# Patient Record
Sex: Male | Born: 1950 | Race: White | Hispanic: No | Marital: Married | Smoking: Never smoker
Health system: Southern US, Community
[De-identification: ages and names within clinical notes are randomized; demographics above are authoritative.]

## PROBLEM LIST (undated history)

## (undated) DIAGNOSIS — I480 Paroxysmal atrial fibrillation: Secondary | ICD-10-CM

## (undated) DIAGNOSIS — I4811 Longstanding persistent atrial fibrillation: Secondary | ICD-10-CM

## (undated) DIAGNOSIS — Z7901 Long term (current) use of anticoagulants: Secondary | ICD-10-CM

## (undated) DIAGNOSIS — K5790 Diverticulosis of intestine, part unspecified, without perforation or abscess without bleeding: Secondary | ICD-10-CM

## (undated) DIAGNOSIS — R7302 Impaired glucose tolerance (oral): Secondary | ICD-10-CM

## (undated) DIAGNOSIS — Z6829 Body mass index (BMI) 29.0-29.9, adult: Secondary | ICD-10-CM

## (undated) HISTORY — DX: Longstanding persistent atrial fibrillation: I48.11

## (undated) HISTORY — DX: Paroxysmal atrial fibrillation: I48.0

## (undated) HISTORY — DX: Long term (current) use of anticoagulants: Z79.01

## (undated) HISTORY — DX: Diverticulosis of intestine, part unspecified, without perforation or abscess without bleeding: K57.90

## (undated) HISTORY — DX: Impaired glucose tolerance (oral): R73.02

## (undated) HISTORY — DX: Body mass index (BMI) 29.0-29.9, adult: Z68.29

## (undated) HISTORY — PX: CATARACT EXTRACTION, BILATERAL: SHX1313

---

## 2018-06-08 DIAGNOSIS — Z23 Encounter for immunization: Secondary | ICD-10-CM | POA: Diagnosis not present

## 2018-06-08 DIAGNOSIS — I4891 Unspecified atrial fibrillation: Secondary | ICD-10-CM | POA: Diagnosis not present

## 2019-01-28 DIAGNOSIS — Z6829 Body mass index (BMI) 29.0-29.9, adult: Secondary | ICD-10-CM | POA: Diagnosis not present

## 2019-01-28 DIAGNOSIS — I4811 Longstanding persistent atrial fibrillation: Secondary | ICD-10-CM | POA: Diagnosis not present

## 2019-01-28 DIAGNOSIS — Z13220 Encounter for screening for lipoid disorders: Secondary | ICD-10-CM | POA: Diagnosis not present

## 2019-01-28 DIAGNOSIS — Z7901 Long term (current) use of anticoagulants: Secondary | ICD-10-CM | POA: Diagnosis not present

## 2019-01-28 DIAGNOSIS — R7302 Impaired glucose tolerance (oral): Secondary | ICD-10-CM | POA: Diagnosis not present

## 2019-01-28 DIAGNOSIS — Z125 Encounter for screening for malignant neoplasm of prostate: Secondary | ICD-10-CM | POA: Diagnosis not present

## 2019-01-28 DIAGNOSIS — Z0000 Encounter for general adult medical examination without abnormal findings: Secondary | ICD-10-CM | POA: Diagnosis not present

## 2019-01-28 DIAGNOSIS — K5790 Diverticulosis of intestine, part unspecified, without perforation or abscess without bleeding: Secondary | ICD-10-CM | POA: Diagnosis not present

## 2019-02-19 ENCOUNTER — Telehealth: Payer: Self-pay

## 2019-02-19 ENCOUNTER — Encounter: Payer: Self-pay | Admitting: Cardiology

## 2019-02-19 DIAGNOSIS — I480 Paroxysmal atrial fibrillation: Secondary | ICD-10-CM | POA: Insufficient documentation

## 2019-02-19 DIAGNOSIS — K5790 Diverticulosis of intestine, part unspecified, without perforation or abscess without bleeding: Secondary | ICD-10-CM | POA: Insufficient documentation

## 2019-02-19 NOTE — Progress Notes (Signed)
Virtual Visit via Video Note   This visit type was conducted due to national recommendations for restrictions regarding the COVID-19 Pandemic (e.g. social distancing) in an effort to limit this patient's exposure and mitigate transmission in our community.  Due to his co-morbid illnesses, this patient is at least at moderate risk for complications without adequate follow up.  This format is felt to be most appropriate for this patient at this time.  All issues noted in this document were discussed and addressed.  A limited physical exam was performed with this format.  Please refer to the patient's chart for his consent to telehealth for [facility].   Evaluation Performed: Cardiology Consult  This visit type was conducted due to national recommendations for restrictions regarding the COVID-19 Pandemic (e.g. social distancing).  This format is felt to be most appropriate for this patient at this time.  All issues noted in this document were discussed and addressed.  No physical exam was performed (except for noted visual exam findings with Video Visits).  Please refer to the patient's chart (MyChart message for video visits and phone note for telephone visits) for the patient's consent to telehealth for [facility].  Date:  02/20/2019   ID:  Kevin Wilkins, DOB 01/26/1951, MRN [id number]  Patient Location:  Home  Provider location:   [place]  PCP:  Koren ShiverMasneri, Shannon M, DO  Cardiologist:  NEW Electrophysiologist:  None   Chief Complaint:  Establish cardiac care  History of Present Illness:    Kevin Wilkins is a 68 y.o. male who presents via audio/video conferencing for a telehealth visit today in referral by Myles LippsShannon Masneri, DO to establish cardiac care for atrial fibrillation.  This is a very pleasant 68yo male who recently moved from [place] to [place] last fall.  He has a hx of PAF and is on Sotolol and Pradaxa.  He was initially dx with PAF in 2012.  His afib has been fairly well  controlled on Sotolol 40mg  BID.  His only other medical problem is a hx of diverticulosis but no problems with rectal bleeding.    He is here today for followup and is doing well.  He denies any chest pain or pressure, SOB, DOE, PND, orthopnea, LE edema, dizziness or syncope. He is compliant with his meds and is tolerating meds with no SE.   He tells me that he rarely has any problems with skipped heartbeats.   The patient does not have symptoms concerning for COVID-19 infection (fever, chills, cough, or new shortness of breath).   Prior CV studies:   The following studies were reviewed today: none  Past Medical History:  Diagnosis Date  . Adult body mass index 29.0-29.9   . Chronic anticoagulation   . Diverticulosis   . Impaired glucose tolerance   . Longstanding persistent atrial fibrillation   . PAF (paroxysmal atrial fibrillation) (HCC)     Current Meds  Medication Sig  . PRADAXA 150 MG CAPS capsule Take 150 mg by mouth 2 (two) times a day.  . sotalol (BETAPACE) 80 MG tablet Take 40 mg by mouth 2 (two) times a day.     Allergies:   Patient has no known allergies.   Social History   Tobacco Use  . Smoking status: Never Smoker  . Smokeless tobacco: Never Used  Substance Use Topics  . Alcohol use: Not on file  . Drug use: Not on file     Family Hx: The patient's family history includes Congestive Heart Failure  in his mother.  ROS:   Please see the history of present illness.     All other systems reviewed and are negative.   Labs/Other Tests and Data Reviewed:    Recent Labs: No results found for requested labs within last 8760 hours.   Recent Lipid Panel No results found for: CHOL, TRIG, HDL, CHOLHDL, LDLCALC, LDLDIRECT  Wt Readings from Last 3 Encounters:  02/20/19 200 lb (90.7 kg)     Objective:    Vital Signs:  BP 130/84   Pulse 70   Ht 5\' 10"  (1.778 m)   Wt 200 lb (90.7 kg)   BMI 28.70 kg/m    CONSTITUTIONAL:  Well nourished, well developed  male in no acute distress.  EYES: anicteric MOUTH: oral mucosa is pink RESPIRATORY: Normal respiratory effort, symmetric expansion CARDIOVASCULAR: No peripheral edema SKIN: No rash, lesions or ulcers MUSCULOSKELETAL: no digital cyanosis NEURO: Cranial Nerves II-XII grossly intact, moves all extremities PSYCH: Intact judgement and insight.  A&O x 3, Mood/affect appropriate   ASSESSMENT & PLAN:    1.  Paroxysmal atrial fibrillation - he has been maintaining NSR for some time on Sotolol 40mg  BID.  He denies any palpitations.  He has not had any bleeding problems on Pradaxa.  He will continue on Sotolol and Pradaxa.  I will try to get old records of 2D echo in the past.  He is interested in getting off anticoagulation since his A. fib is fairly well controlled.  I have recommended getting a 30-day long-term ZIO patch monitor to assess for A. fib burden.  If there is no A. fib then I think it is fine for him to stop Pradaxa since his CHADS2VASC score is only 1.  2.  COVID-19 Education:The signs and symptoms of COVID-19 were discussed with the patient and how to seek care for testing (follow up with PCP or arrange E-visit).  The importance of social distancing was discussed today.  Patient Risk:   After full review of this patient's clinical status, I feel that they are at least moderate risk at this time.  Time:   Today, I have spent 20 minutes directly with the patient on video discussing medical problems including PAF.  We also reviewed the symptoms of COVID 19 and the ways to protect against contracting the virus with telehealth technology.  I spent an additional 5 minutes reviewing patient's chart including office notes from PCP.  Medication Adjustments/Labs and Tests Ordered: Current medicines are reviewed at length with the patient today.  Concerns regarding medicines are outlined above.  Tests Ordered: No orders of the defined types were placed in this encounter.  Medication  Changes: No orders of the defined types were placed in this encounter.   Disposition:  Follow up in 6 month(s)  Signed, Armanda Magic, MD  02/20/2019 9:57 AM    [facility]

## 2019-02-19 NOTE — Telephone Encounter (Signed)
Virtual Visit Pre-Appointment Phone Call  "(Name), I am calling you today to discuss your upcoming appointment. We are currently trying to limit exposure to the virus that causes COVID-19 by seeing patients at home rather than in the office."  1. "What is the BEST phone number to call the day of the visit?" - include this in appointment notes  2. "Do you have or have access to (through a family member/friend) a smartphone with video capability that we can use for your visit?" a. If yes - list this number in appt notes as "cell" (if different from BEST phone #) and list the appointment type as a VIDEO visit in appointment notes b. If no - list the appointment type as a PHONE visit in appointment notes  3. Confirm consent - "In the setting of the current Covid19 crisis, you are scheduled for a (phone or video) visit with your provider on (date) at (time).  Just as we do with many in-office visits, in order for you to participate in this visit, we must obtain consent.  If you'd like, I can send this to your mychart (if signed up) or email for you to review.  Otherwise, I can obtain your verbal consent now.  All virtual visits are billed to your insurance company just like a normal visit would be.  By agreeing to a virtual visit, we'd like you to understand that the technology does not allow for your provider to perform an examination, and thus may limit your provider's ability to fully assess your condition. If your provider identifies any concerns that need to be evaluated in person, we will make arrangements to do so.  Finally, though the technology is pretty good, we cannot assure that it will always work on either your or our end, and in the setting of a video visit, we may have to convert it to a phone-only visit.  In either situation, we cannot ensure that we have a secure connection.  Are you willing to proceed?" STAFF: Did the patient verbally acknowledge consent to telehealth visit? Document  YES/NO here: YES  4. Advise patient to be prepared - "Two hours prior to your appointment, go ahead and check your blood pressure, pulse, oxygen saturation, and your weight (if you have the equipment to check those) and write them all down. When your visit starts, your provider will ask you for this information. If you have an Apple Watch or Kardia device, please plan to have heart rate information ready on the day of your appointment. Please have a pen and paper handy nearby the day of the visit as well."  5. Give patient instructions for MyChart download to smartphone OR Doximity/Doxy.me as below if video visit (depending on what platform provider is using)  6. Inform patient they will receive a phone call 15 minutes prior to their appointment time (may be from unknown caller ID) so they should be prepared to answer    TELEPHONE CALL NOTE  Kevin Wilkins has been deemed a candidate for a follow-up tele-health visit to limit community exposure during the Covid-19 pandemic. I spoke with the patient via phone to ensure availability of phone/video source, confirm preferred email & phone number, and discuss instructions and expectations.  I reminded Kevin Wilkins to be prepared with any vital sign and/or heart rhythm information that could potentially be obtained via home monitoring, at the time of his visit. I reminded Kevin Wilkins to expect a phone call prior to his visit.  Cierah Crader, CMA 02/19/2019 4:26 PM   INSTRUCTIONS FOR DOWNLOADING THE MYCHART APP TO SMARTPHONE  - The patient must first make sure to have activated MyChart and know their login information - If Apple, go to [employer] Store and type in MyChart in the search bar and download the app. If Android, ask patient to go to [employer] Play Store and type in [place] in the search bar and download the app. The app is free but as with any other app downloads, their phone may require them to verify saved payment information or  Apple/Android password.  - The patient will need to then log into the app with their MyChart username and password, and select [place] as their healthcare provider to link the account. When it is time for your visit, go to the MyChart app, find appointments, and click Begin Video Visit. Be sure to Select Allow for your device to access the Microphone and Camera for your visit. You will then be connected, and your provider will be with you shortly.  **If they have any issues connecting, or need assistance please contact MyChart service desk [phone])**  **If using a computer, in order to ensure the best quality for their visit they will need to use either of the following Internet Browsers: [employer] Edge, or Google Chrome**  IF USING DOXIMITY or DOXY.ME - The patient will receive a link just prior to their visit by text.     FULL LENGTH CONSENT FOR TELE-HEALTH VISIT   I hereby voluntarily request, consent and authorize [facility] and its employed or contracted physicians, physician assistants, nurse practitioners or other licensed health care professionals (the Practitioner), to provide me with telemedicine health care services (the "Services") as deemed necessary by the treating Practitioner. I acknowledge and consent to receive the Services by the Practitioner via telemedicine. I understand that the telemedicine visit will involve communicating with the Practitioner through live audiovisual communication technology and the disclosure of certain medical information by electronic transmission. I acknowledge that I have been given the opportunity to request an in-person assessment or other available alternative prior to the telemedicine visit and am voluntarily participating in the telemedicine visit.  I understand that I have the right to withhold or withdraw my consent to the use of telemedicine in the course of my care at any time, without affecting my right to future care  or treatment, and that the Practitioner or I may terminate the telemedicine visit at any time. I understand that I have the right to inspect all information obtained and/or recorded in the course of the telemedicine visit and may receive copies of available information for a reasonable fee.  I understand that some of the potential risks of receiving the Services via telemedicine include:  Marland Kitchen. Delay or interruption in medical evaluation due to technological equipment failure or disruption; . Information transmitted may not be sufficient (e.g. poor resolution of images) to allow for appropriate medical decision making by the Practitioner; and/or  . In rare instances, security protocols could fail, causing a breach of personal health information.  Furthermore, I acknowledge that it is my responsibility to provide information about my medical history, conditions and care that is complete and accurate to the best of my ability. I acknowledge that Practitioner's advice, recommendations, and/or decision may be based on factors not within their control, such as incomplete or inaccurate data provided by me or distortions of diagnostic images or specimens that may result from electronic transmissions. I understand that the  practice of medicine is not an Chief Strategy Officer and that Practitioner makes no warranties or guarantees regarding treatment outcomes. I acknowledge that I will receive a copy of this consent concurrently upon execution via email to the email address I last provided but may also request a printed copy by calling the office of [place].    I understand that my insurance will be billed for this visit.   I have read or had this consent read to me. . I understand the contents of this consent, which adequately explains the benefits and risks of the Services being provided via telemedicine.  . I have been provided ample opportunity to ask questions regarding this consent and the Services and have had  my questions answered to my satisfaction. . I give my informed consent for the services to be provided through the use of telemedicine in my medical care  By participating in this telemedicine visit I agree to the above.

## 2019-02-20 ENCOUNTER — Telehealth: Payer: Self-pay | Admitting: Radiology

## 2019-02-20 ENCOUNTER — Encounter: Payer: Self-pay | Admitting: Cardiology

## 2019-02-20 ENCOUNTER — Other Ambulatory Visit: Payer: Self-pay

## 2019-02-20 ENCOUNTER — Telehealth (INDEPENDENT_AMBULATORY_CARE_PROVIDER_SITE_OTHER): Payer: Medicare HMO | Admitting: Cardiology

## 2019-02-20 VITALS — BP 130/84 | HR 70 | Ht 70.0 in | Wt 200.0 lb

## 2019-02-20 DIAGNOSIS — I480 Paroxysmal atrial fibrillation: Secondary | ICD-10-CM | POA: Diagnosis not present

## 2019-02-20 DIAGNOSIS — Z7189 Other specified counseling: Secondary | ICD-10-CM | POA: Diagnosis not present

## 2019-02-20 NOTE — Patient Instructions (Signed)
Medication Instructions:  Your physician recommends that you continue on your current medications as directed. Please refer to the Current Medication list given to you today.  If you need a refill on your cardiac medications before your next appointment, please call your pharmacy.   Lab work: None If you have labs (blood work) drawn today and your tests are completely normal, you will receive your results only by: Marland Kitchen MyChart Message (if you have MyChart) OR . A paper copy in the mail If you have any lab test that is abnormal or we need to change your treatment, we will call you to review the results.  Testing/Procedures: Schedule a Conservator, museum/gallery, 30 day.   Follow-Up: At [facility], you and your health needs are our priority.  As part of our continuing mission to provide you with exceptional heart care, we have created designated Provider Care Teams.  These Care Teams include your primary Cardiologist (physician) and Advanced Practice Providers (APPs -  Physician Assistants and Nurse Practitioners) who all work together to provide you with the care you need, when you need it. You will need a follow up appointment in 6 months.  Please call our office 2 months in advance to schedule this appointment.  You may see Dr. Mayford Knife or one of the following Advanced Practice Providers on your designated Care Team:   Nelson, PA-C Ronie Spies, PA-C . Jacolyn Reedy, PA-C

## 2019-02-20 NOTE — Telephone Encounter (Signed)
Enrolled patient for a 30 [employer] Cardiac Event monitor to be mailed. Brief instructions were gone over with the patient and he knows to expect the monitor to arrive in 2-3 days.

## 2019-02-24 ENCOUNTER — Encounter: Payer: Self-pay | Admitting: Cardiology

## 2019-02-24 ENCOUNTER — Ambulatory Visit (INDEPENDENT_AMBULATORY_CARE_PROVIDER_SITE_OTHER): Payer: Medicare HMO

## 2019-02-24 DIAGNOSIS — I480 Paroxysmal atrial fibrillation: Secondary | ICD-10-CM

## 2019-03-12 ENCOUNTER — Telehealth: Payer: Self-pay | Admitting: *Deleted

## 2019-03-12 DIAGNOSIS — I491 Atrial premature depolarization: Secondary | ICD-10-CM

## 2019-03-12 DIAGNOSIS — I480 Paroxysmal atrial fibrillation: Secondary | ICD-10-CM

## 2019-03-12 NOTE — Telephone Encounter (Signed)
Please get a copy of arrhythmias scanned into Epic.  Please order a 2D echo to assess LVF

## 2019-03-12 NOTE — Telephone Encounter (Signed)
Pt returning your call. Please call back °

## 2019-03-12 NOTE — Telephone Encounter (Signed)
Received Day 16 of 30 -Serious- monitor report for pt.   Recorded 03/11/19 7:42 am(CST)   - SR w/ 5 beat run of VTACH/PACs  This was auto triggered. Wearing to look for his PAF, interested in getting off anticoagulation. Left message for patient to call back.  Reviewed with Dr. Elease Hashimoto (DOD)  --non sustained VT, on sotolol, no changes Will forward to Dr. Mayford Knife as [place] Childes

## 2019-03-13 NOTE — Telephone Encounter (Signed)
Spoke with the patient, he accepted having an echo done to assess LVF.

## 2019-03-14 ENCOUNTER — Telehealth: Payer: Self-pay | Admitting: Cardiology

## 2019-03-14 NOTE — Telephone Encounter (Signed)
New Message    Pt is returning a call for [facility]    Please call back

## 2019-03-15 NOTE — Telephone Encounter (Signed)
I still want the studies

## 2019-03-15 NOTE — Telephone Encounter (Signed)
Spoke with the patient, he was exercising during the time of the 5 beats of V-tach. He stated he was doing a difficult workout at the time. He wanted to know if this would change anything.

## 2019-03-15 NOTE — Telephone Encounter (Signed)
Notes recorded by Quintella Reichert, MD on 03/13/2019 at 4:33 PM EDT Please get a lexiscan myoview in addition to echo to evaluate for ischemia given NSVT

## 2019-03-18 NOTE — Telephone Encounter (Signed)
Spoke with the patient, he expressed understanding about having the testing.

## 2019-03-29 ENCOUNTER — Telehealth (HOSPITAL_COMMUNITY): Payer: Self-pay | Admitting: Radiology

## 2019-03-29 NOTE — Telephone Encounter (Signed)
COVID-19 Pre-Screening Questions:  . Do you currently have a fever?No (yes = cancel and refer to pcp for e-visit) . Have you recently travelled on a cruise, internationally, or to [place], [place], [place], [place], [place], or [place], [place] ([place]) ?NO (yes = cancel, stay home, monitor symptoms, and contact pcp or initiate e-visit if symptoms develop) . Have you been in contact with someone that is currently pending confirmation of Covid19 testing or has been confirmed to have the Covid19 virus?NO (yes = cancel, stay home, away from tested individual, monitor symptoms, and contact pcp or initiate e-visit if symptoms develop) . Are you currently experiencing fatigue or coughNO (yes = pt should be prepared to have a mask placed at the time of their visit). . Reiterated no additional visitors. . Arrive no earlier than 15 minutes before appointment time. . Please bring own mask.    

## 2019-04-01 ENCOUNTER — Ambulatory Visit (HOSPITAL_COMMUNITY): Payer: Medicare HMO | Attending: Cardiovascular Disease

## 2019-04-01 ENCOUNTER — Other Ambulatory Visit: Payer: Self-pay

## 2019-04-01 DIAGNOSIS — I491 Atrial premature depolarization: Secondary | ICD-10-CM | POA: Diagnosis not present

## 2019-04-01 DIAGNOSIS — I480 Paroxysmal atrial fibrillation: Secondary | ICD-10-CM | POA: Diagnosis not present

## 2019-04-02 ENCOUNTER — Telehealth: Payer: Self-pay

## 2019-04-02 DIAGNOSIS — I472 Ventricular tachycardia: Secondary | ICD-10-CM

## 2019-04-02 DIAGNOSIS — I4729 Other ventricular tachycardia: Secondary | ICD-10-CM

## 2019-04-02 DIAGNOSIS — I10 Essential (primary) hypertension: Secondary | ICD-10-CM

## 2019-04-02 NOTE — Telephone Encounter (Signed)
Spoke with the patient he expressed understanding about having a cardiac CT and stopping his anticoagulation. He is scheduled for labs on 04/05/19.

## 2019-04-02 NOTE — Telephone Encounter (Signed)
Notes recorded by Sueanne Margarita, MD on 04/01/2019 at 7:51 PM EDT  Please let patient know that heart monitor showed NSR with no afib and fast heart rhythm from bottom of heart for 5 beats. Recent echo showed normal LVF. Please get a coronary CTA to rule out CAD given NSVT. OK to stop anticoagulation given no afib and low CHADS2VASC score.

## 2019-04-02 NOTE — Telephone Encounter (Signed)
Lopressor 50mg  for CTA should be fine

## 2019-04-02 NOTE — Telephone Encounter (Signed)
-----   Message from Sueanne Margarita, MD sent at 04/01/2019  7:52 PM EDT ----- Please get a BMET as well as mag level and TSH

## 2019-04-03 MED ORDER — METOPROLOL TARTRATE 50 MG PO TABS: ORAL_TABLET | ORAL | 0 refills | Status: DC

## 2019-04-05 ENCOUNTER — Other Ambulatory Visit: Payer: Self-pay

## 2019-04-05 ENCOUNTER — Other Ambulatory Visit: Payer: Medicare HMO | Admitting: *Deleted

## 2019-04-05 DIAGNOSIS — I4729 Other ventricular tachycardia: Secondary | ICD-10-CM

## 2019-04-05 DIAGNOSIS — I472 Ventricular tachycardia: Secondary | ICD-10-CM | POA: Diagnosis not present

## 2019-04-05 LAB — BASIC METABOLIC PANEL
BUN/Creatinine Ratio: 18 (ref 10–24)
BUN: 18 mg/dL (ref 8–27)
CO2: 25 mmol/L (ref 20–29)
Calcium: 9.3 mg/dL (ref 8.6–10.2)
Chloride: 100 mmol/L (ref 96–106)
Creatinine, Ser: 1.02 mg/dL (ref 0.76–1.27)
GFR calc Af Amer: 88 mL/min/{1.73_m2} (ref >59)
GFR calc non Af Amer: 76 mL/min/{1.73_m2} (ref >59)
Glucose: 70 mg/dL (ref 65–99)
Potassium: 4.5 mmol/L (ref 3.5–5.2)
Sodium: 138 mmol/L (ref 134–144)

## 2019-04-05 LAB — MAGNESIUM: Magnesium: 1.8 mg/dL (ref 1.6–2.3)

## 2019-04-05 LAB — TSH: TSH: 1.95 u[IU]/mL (ref 0.450–4.500)

## 2019-04-29 ENCOUNTER — Encounter (HOSPITAL_COMMUNITY): Payer: Self-pay

## 2019-04-29 ENCOUNTER — Ambulatory Visit (HOSPITAL_COMMUNITY)
Admission: RE | Admit: 2019-04-29 | Discharge: 2019-04-29 | Disposition: A | Discharge status: Home / Self Care | Payer: Medicare HMO | Source: Ambulatory Visit | Attending: Cardiology | Admitting: Cardiology

## 2019-04-29 ENCOUNTER — Ambulatory Visit (HOSPITAL_COMMUNITY): Admission: RE | Admit: 2019-04-29 | Payer: Medicare HMO | Source: Ambulatory Visit

## 2019-04-29 ENCOUNTER — Other Ambulatory Visit: Payer: Self-pay

## 2019-04-29 DIAGNOSIS — I472 Ventricular tachycardia: Secondary | ICD-10-CM | POA: Diagnosis not present

## 2019-04-29 DIAGNOSIS — I4729 Other ventricular tachycardia: Secondary | ICD-10-CM

## 2019-04-29 MED ORDER — IOHEXOL 350 MG/ML SOLN
80.0000 mL | Freq: Once | INTRAVENOUS | Status: AC | PRN
  Administered 2019-04-29: 14:00:00 80 mL via INTRAVENOUS

## 2019-04-29 MED ORDER — NITROGLYCERIN 0.4 MG SL SUBL
0.8000 mg | SUBLINGUAL_TABLET | Freq: Once | SUBLINGUAL | Status: AC
  Administered 2019-04-29: 0.8 mg via SUBLINGUAL
  Filled 2019-04-29: qty 25

## 2019-04-29 MED ORDER — NITROGLYCERIN 0.4 MG SL SUBL
SUBLINGUAL_TABLET | SUBLINGUAL | Status: AC
  Filled 2019-04-29: qty 2

## 2019-05-01 ENCOUNTER — Telehealth: Payer: Self-pay

## 2019-05-01 NOTE — Telephone Encounter (Signed)
-----   Message from Sueanne Margarita, MD sent at 05/01/2019  4:21 PM EDT ----- COronary CTA showed no evidence of CAD

## 2019-05-01 NOTE — Telephone Encounter (Signed)
Notes recorded by Frederik Schmidt, RN on 05/01/2019 at 4:25 PM EDT  lpmtcb 7/15  ------

## 2019-09-03 DIAGNOSIS — R69 Illness, unspecified: Secondary | ICD-10-CM | POA: Diagnosis not present

## 2019-09-16 DIAGNOSIS — Z20828 Contact with and (suspected) exposure to other viral communicable diseases: Secondary | ICD-10-CM | POA: Diagnosis not present

## 2019-10-01 ENCOUNTER — Telehealth: Payer: Self-pay

## 2019-10-01 NOTE — Telephone Encounter (Signed)
° ° °YOUR CARDIOLOGY TEAM HAS ARRANGED FOR AN E-VISIT FOR YOUR APPOINTMENT - PLEASE REVIEW IMPORTANT INFORMATION BELOW SEVERAL DAYS PRIOR TO YOUR APPOINTMENT ° °Due to the recent COVID-19 pandemic, we are transitioning in-person office visits to tele-medicine visits in an effort to decrease unnecessary exposure to our patients, their families, and staff. These visits are billed to your insurance just like a normal visit is. We also encourage you to sign up for MyChart if you have not already done so. You will need a smartphone if possible. For patients that do not have this, we can still complete the visit using a regular telephone but do prefer a smartphone to enable video when possible. You may have a family member that lives with you that can help. If possible, we also ask that you have a blood pressure cuff and scale at home to measure your blood pressure, heart rate and weight prior to your scheduled appointment. Patients with clinical needs that need an in-person evaluation and testing will still be able to come to the office if absolutely necessary. If you have any questions, feel free to call our office. ° ° ° °THE DAY OF YOUR APPOINTMENT ° °Approximately 15 minutes prior to your scheduled appointment, you will receive a telephone call from one of [facility] team - your caller ID may say "Unknown caller."  Our staff will confirm medications, vital signs for the day and any symptoms you may be experiencing. Please have this information available prior to the time of visit start. It may also be helpful for you to have a pad of paper and pen handy for any instructions given during your visit. They will also walk you through joining the smartphone meeting if this is a video visit. ° ° ° °CONSENT FOR TELE-HEALTH VISIT - PLEASE REVIEW ° °I hereby voluntarily request, consent and authorize [facility] and its employed or contracted physicians, physician assistants, nurse practitioners or other licensed health  care professionals (the Practitioner), to provide me with telemedicine health care services (the “Services") as deemed necessary by the treating Practitioner. I acknowledge and consent to receive the Services by the Practitioner via telemedicine. I understand that the telemedicine visit will involve communicating with the Practitioner through live audiovisual communication technology and the disclosure of certain medical information by electronic transmission. I acknowledge that I have been given the opportunity to request an in-person assessment or other available alternative prior to the telemedicine visit and am voluntarily participating in the telemedicine visit. ° °I understand that I have the right to withhold or withdraw my consent to the use of telemedicine in the course of my care at any time, without affecting my right to future care or treatment, and that the Practitioner or I may terminate the telemedicine visit at any time. I understand that I have the right to inspect all information obtained and/or recorded in the course of the telemedicine visit and may receive copies of available information for a reasonable fee.  I understand that some of the potential risks of receiving the Services via telemedicine include:  °• Delay or interruption in medical evaluation due to technological equipment failure or disruption; °• Information transmitted may not be sufficient (e.g. poor resolution of images) to allow for appropriate medical decision making by the Practitioner; and/or  °• In rare instances, security protocols could fail, causing a breach of personal health information. ° °Furthermore, I acknowledge that it is my responsibility to provide information about my medical history, conditions and care that   is complete and accurate to the best of my ability. I acknowledge that Practitioner's advice, recommendations, and/or decision may be based on factors not within their control, such as incomplete or  inaccurate data provided by me or distortions of diagnostic images or specimens that may result from electronic transmissions. I understand that the practice of medicine is not an exact science and that Practitioner makes no warranties or guarantees regarding treatment outcomes. I acknowledge that I will receive a copy of this consent concurrently upon execution via email to the email address I last provided but may also request a printed copy by calling the office of [facility].   ° °I understand that my insurance will be billed for this visit.  ° °I have read or had this consent read to me. °• I understand the contents of this consent, which adequately explains the benefits and risks of the Services being provided via telemedicine.  °• I have been provided ample opportunity to ask questions regarding this consent and the Services and have had my questions answered to my satisfaction. °• I give my informed consent for the services to be provided through the use of telemedicine in my medical care ° °By participating in this telemedicine visit I agree to the above. ° °

## 2019-10-02 ENCOUNTER — Telehealth (INDEPENDENT_AMBULATORY_CARE_PROVIDER_SITE_OTHER): Payer: Medicare HMO | Admitting: Cardiology

## 2019-10-02 ENCOUNTER — Other Ambulatory Visit: Payer: Self-pay

## 2019-10-02 ENCOUNTER — Encounter: Payer: Self-pay | Admitting: Cardiology

## 2019-10-02 VITALS — BP 140/85 | HR 64 | Ht 70.5 in | Wt 198.0 lb

## 2019-10-02 DIAGNOSIS — I480 Paroxysmal atrial fibrillation: Secondary | ICD-10-CM | POA: Diagnosis not present

## 2019-10-02 NOTE — Patient Instructions (Signed)
Medication Instructions:  °Your physician recommends that you continue on your current medications as directed. Please refer to the Current Medication list given to you today. ° °*If you need a refill on your cardiac medications before your next appointment, please call your pharmacy* ° °Follow-Up: °At [facility], you and your health needs are our priority.  As part of our continuing mission to provide you with exceptional heart care, we have created designated Provider Care Teams.  These Care Teams include your primary Cardiologist (physician) and Advanced Practice Providers (APPs -  Physician Assistants and Nurse Practitioners) who all work together to provide you with the care you need, when you need it. ° ° °Your next appointment:   °1 year(s) ° °The format for your next appointment:   °Either In Person or Virtual ° °Provider:   °You may see Traci Turner, MD or one of the following Advanced Practice Providers on your designated Care Team:   °· Dayna Dunn, PA-C °· Michele Lenze, PA-C ° ° °

## 2019-10-02 NOTE — Progress Notes (Signed)
Virtual Visit via Video Note   This visit type was conducted due to national recommendations for restrictions regarding the COVID-19 Pandemic (e.g. social distancing) in an effort to limit this patient's exposure and mitigate transmission in our community.  Due to his co-morbid illnesses, this patient is at least at moderate risk for complications without adequate follow up.  This format is felt to be most appropriate for this patient at this time.  All issues noted in this document were discussed and addressed.  A limited physical exam was performed with this format.  Please refer to the patient's chart for his consent to telehealth for [facility].   Evaluation Performed:  Follow-up visit  This visit type was conducted due to national recommendations for restrictions regarding the COVID-19 Pandemic (e.g. social distancing).  This format is felt to be most appropriate for this patient at this time.  All issues noted in this document were discussed and addressed.  No physical exam was performed (except for noted visual exam findings with Video Visits).  Please refer to the patient's chart (MyChart message for video visits and phone note for telephone visits) for the patient's consent to telehealth for [facility].  Date:  10/02/2019   ID:  Kevin Wilkins, DOB 1950/12/31, MRN [id number]  Patient Location:  Home  Provider location:   [place]  PCP:  Lyman Bishop, DO  Cardiologist:  Fransico Him, MD  Electrophysiologist:  None   Chief Complaint:  PAF  History of Present Illness:    Kevin Wilkins is a 68 y.o. male who presents via audio/video conferencing for a telehealth visit today.    This is a very pleasant 68yo male with a hx of PAF and is on Sotolol and Pradaxa.  He was initially dx with PAF in 2012.  His afib has been fairly well controlled on Sotolol 40mg  BID. His only other medical problem is a hx of diverticulosis but no problems with rectal bleeding.  His  CHADS2VASC score is 1 (age>65) and he worse a heart monitor after last OV to get off anticoagulation.  This showed no afib and his Pradaxa was stopped.  The heart monitor did show a 5 beat run of WCT and a coronary CTA was done showing no CAD and a Ca score of ).  She is here today for followup and is doing well.  She denies any chest pain or pressure, SOB, DOE, PND, orthopnea, LE edema, dizziness, palpitations or syncope. She is compliant with her meds and is tolerating meds with no SE.    The patient does not have symptoms concerning for COVID-19 infection (fever, chills, cough, or new shortness of breath).    Prior CV studies:   The following studies were reviewed today:  Event monitor and coronary CTA  Past Medical History:  Diagnosis Date  . Adult body mass index 29.0-29.9   . Chronic anticoagulation   . Diverticulosis   . Impaired glucose tolerance   . Longstanding persistent atrial fibrillation ([place])   . PAF (paroxysmal atrial fibrillation) ([place])    Past Surgical History:  Procedure Laterality Date  . CATARACT EXTRACTION, BILATERAL       Current Meds  Medication Sig  . sotalol (BETAPACE) 80 MG tablet Take 40 mg by mouth 2 (two) times a day.     Allergies:   Patient has no known allergies.   Social History   Tobacco Use  . Smoking status: Never Smoker  . Smokeless tobacco: Never Used  Substance Use  Topics  . Alcohol use: Not on file  . Drug use: Not on file     Family Hx: The patient's family history includes Congestive Heart Failure in his mother.  ROS:   Please see the history of present illness.     All other systems reviewed and are negative.   Labs/Other Tests and Data Reviewed:    Recent Labs: 04/05/2019: BUN 18; Creatinine, Ser 1.02; Magnesium 1.8; Potassium 4.5; Sodium 138; TSH 1.950   Recent Lipid Panel No results found for: CHOL, TRIG, HDL, CHOLHDL, LDLCALC, LDLDIRECT  Wt Readings from Last 3 Encounters:  10/02/19 198 lb (89.8 kg)  02/20/19  200 lb (90.7 kg)     Objective:    Vital Signs:  BP 140/85   Pulse 64   Ht 5' 10.5" (1.791 m)   Wt 198 lb (89.8 kg)   BMI 28.01 kg/m    CONSTITUTIONAL:  Well nourished, well developed male in no acute distress.  EYES: anicteric MOUTH: oral mucosa is pink RESPIRATORY: Normal respiratory effort, symmetric expansion CARDIOVASCULAR: No peripheral edema SKIN: No rash, lesions or ulcers MUSCULOSKELETAL: no digital cyanosis NEURO: Cranial Nerves II-XII grossly intact, moves all extremities PSYCH: Intact judgement and insight.  A&O x 3, Mood/affect appropriate   ASSESSMENT & PLAN:    1.  PAF -he has been maintaining NSR and denies any palpitations -he will continue on Sotolol 40mg  BID -QTc on EKG in April 2020 -Pradaxa was stopped due to [facility] score of 1 (age>65) and no PAF on event monitor  COVID-19 Education: The signs and symptoms of COVID-19 were discussed with the patient and how to seek care for testing (follow up with PCP or arrange E-visit).  The importance of social distancing was discussed today.  Patient Risk:   After full review of this patient's clinical status, I feel that they are at least moderate risk at this time.  Time:   Today, I have spent 20 minutes on Telemedicine discussing medical problems including PAF.  We also reviewed the symptoms of COVID 19 and the ways to protect against contracting the virus with telehealth technology.  I spent an additional 5 minutes reviewing patient's chart including event monitor and coronary CTA.  Medication Adjustments/Labs and Tests Ordered: Current medicines are reviewed at length with the patient today.  Concerns regarding medicines are outlined above.  Tests Ordered: No orders of the defined types were placed in this encounter.  Medication Changes: No orders of the defined types were placed in this encounter.   Disposition:  Follow up in 1 year(s)  Signed, [facility], MD  10/02/2019 3:35 PM     [facility]

## 2020-03-06 IMAGING — CT CT HEAR MORPH WITH CTA COR WITH SCORE WITH CA WITH CONTRAST AND
4 of 7 series · 8 of 20 positions shown, 9 images · IV contrast (APPLIED)
Comparison: None.
COMPARISON: None.

Addendum:
EXAM:
OVER-READ INTERPRETATION  CT CHEST

The following report is an over-read performed by radiologist Dr.
does not include interpretation of cardiac or coronary anatomy or
pathology. The coronary calcium score/coronary CTA interpretation by
the cardiologist is attached.
TECHNIQUE: The patient was scanned on a Phillips Force scanner.

[Series 6: best diast 77 % · axial · 0.39mm/px · z∈[-133,-84]mm · 2 of 368 slices shown, 3 images]
[im 123/368  vessel]
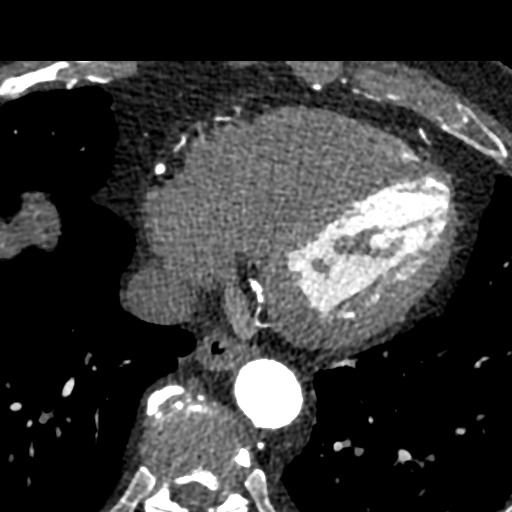
[im 123/368  lung]
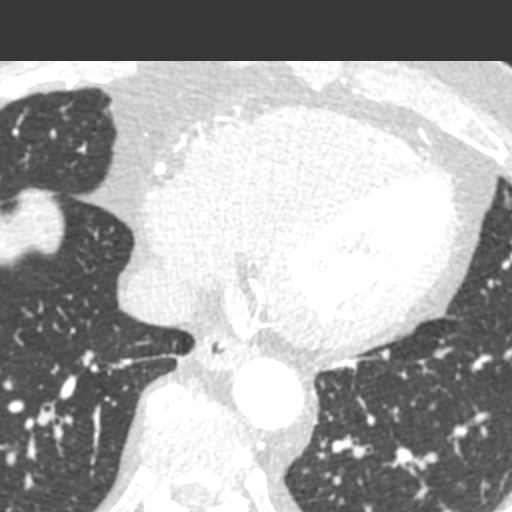
[im 245/368  vessel]
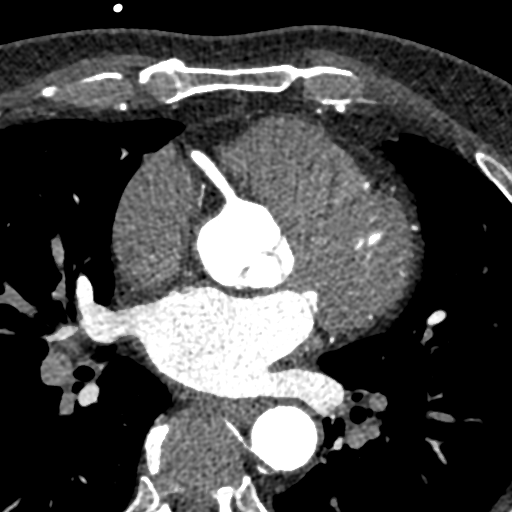

[Series 7: best syst 41 % · axial · 0.39mm/px · z∈[-133,-84]mm · 2 of 368 slices shown]
[im 123/368  vessel]
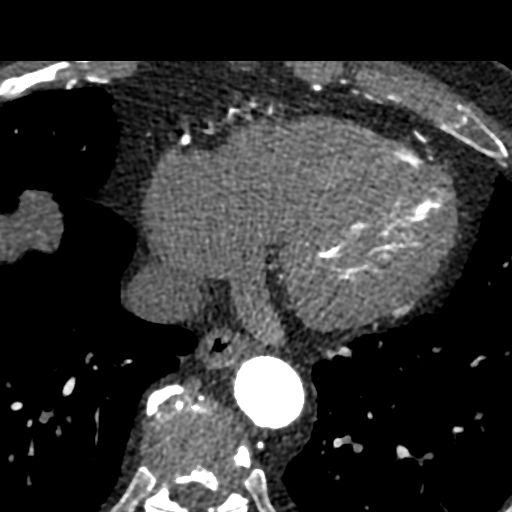
[im 245/368  vessel]
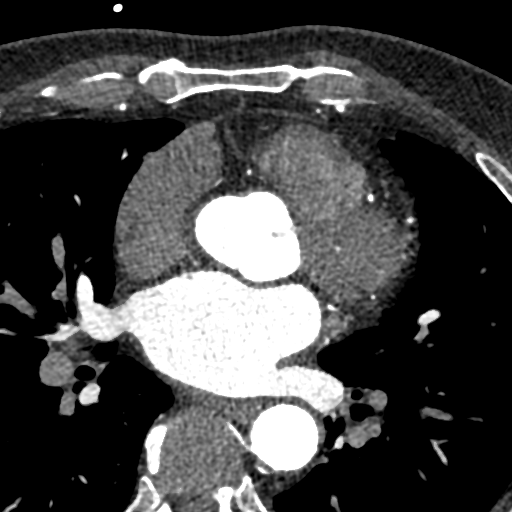

[Series 8: ts diast sharp 77 % · axial · 0.39mm/px · z∈[-133,-84]mm · 2 of 368 slices shown]
[im 123/368  lung]
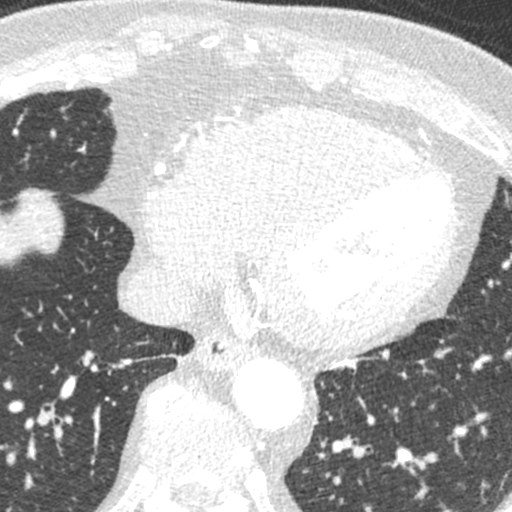
[im 245/368  lung]
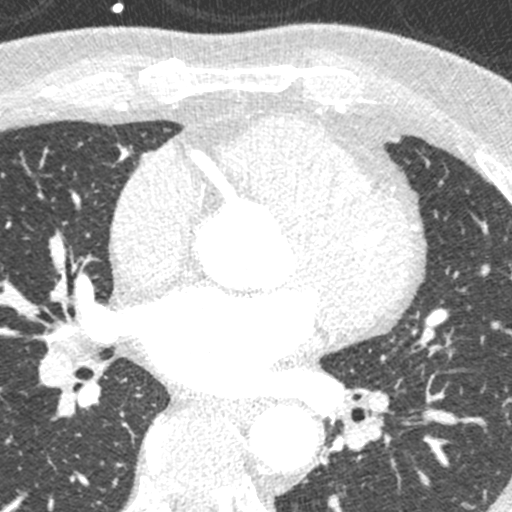

[Series 9: ts syst sharp 41 % · axial · 0.39mm/px · z∈[-133,-84]mm · 2 of 368 slices shown]
[im 123/368  lung]
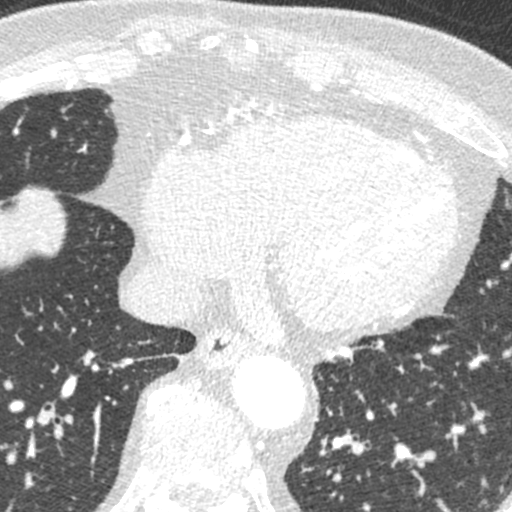
[im 245/368  lung]
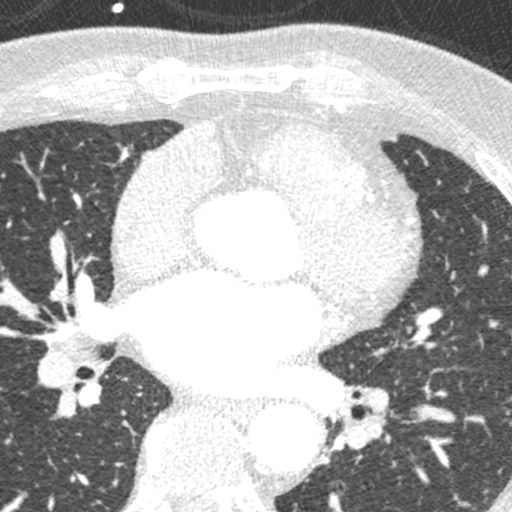

[8 of 20 positions shown; findings below may reference images not displayed]

FINDINGS: The visualized portions of the lower lung fields show no suspicious
nodules, masses, or infiltrates. The visualized portions of the
mediastinum and chest wall are unremarkable.
IMPRESSION: No significant non-cardiovascular abnormality seen in visualized
portion of the thorax.

EXAM:
Cardiac/Coronary  CT
FINDINGS: A 120 kV prospective scan was triggered in the descending thoracic
aorta at 111 HU's. Axial non-contrast 3 mm slices were carried out
through the heart. The data set was analyzed on a dedicated work
station and scored using the Agatson method. Gantry rotation speed
was 250 msecs and collimation was .6 mm. No beta blockade and 0.8 mg
of sl NTG was given. The 3D data set was reconstructed in 5%
intervals of the 67-82 % of the R-R cycle. Diastolic phases were
analyzed on a dedicated work station using MPR, MIP and VRT modes.
The patient received 80 cc of contrast.

Aorta:  Normal size.  No calcifications.  No dissection.

Aortic Valve:  Trileaflet.  No calcifications.

Coronary Arteries:  Normal coronary origin.  Right dominance.

RCA is a very large dominant artery that gives rise to PDA and PLVB.
There is no plaque.

Left main is a large artery that gives rise to LAD, Ramus and LCX
arteries. There is no plaque.

LAD is a large vessel that gives rise to a large branching D1. There
is no plaque in the LAD or Diagonal.

LCX is a non-dominant artery that gives rise to one large OM1
branch. There is no plaque.

Other findings:

Normal pulmonary vein drainage into the left atrium.

Normal let atrial appendage without a thrombus.

Normal size of the pulmonary artery.
IMPRESSION: 1. Coronary calcium score of 0. This was 0 percentile for age and
sex matched control.

2. Normal coronary origin with right dominance.

3. No evidence of CAD.

Reduanul Naha

*** End of Addendum ***
EXAM:
OVER-READ INTERPRETATION  CT CHEST

The following report is an over-read performed by radiologist Dr.
does not include interpretation of cardiac or coronary anatomy or
pathology. The coronary calcium score/coronary CTA interpretation by
the cardiologist is attached.
FINDINGS: The visualized portions of the lower lung fields show no suspicious
nodules, masses, or infiltrates. The visualized portions of the
mediastinum and chest wall are unremarkable.
IMPRESSION: No significant non-cardiovascular abnormality seen in visualized
portion of the thorax.

## 2020-09-28 ENCOUNTER — Ambulatory Visit: Payer: Medicare HMO | Admitting: Cardiology

## 2020-09-28 ENCOUNTER — Other Ambulatory Visit: Payer: Self-pay

## 2020-09-28 ENCOUNTER — Encounter: Payer: Self-pay | Admitting: Cardiology

## 2020-09-28 VITALS — BP 126/76 | HR 64 | Ht 70.5 in | Wt 212.8 lb

## 2020-09-28 DIAGNOSIS — E669 Obesity, unspecified: Secondary | ICD-10-CM

## 2020-09-28 DIAGNOSIS — I480 Paroxysmal atrial fibrillation: Secondary | ICD-10-CM

## 2020-09-28 NOTE — Progress Notes (Signed)
Date:  09/28/2020   ID:  [name], DOB [birth year], MRN [id number]   PCP:  Koren Shiver, DO  Cardiologist:  Armanda Magic, MD  Electrophysiologist:  None   Chief Complaint:  PAF  History of Present Illness:    Kevin Wilkins is a 69 y.o. male with a hx of PAF and is on Sotolol and Pradaxa.  He was initially dx with PAF in 2012.  His afib has been fairly well controlled on Sotolol 40mg  BID. His only other medical problem is a hx of diverticulosis but no problems with rectal bleeding.  His CHADS2VASC score is 1 (age>65) and he wore a heart monitor to get off anticoagulation.  This showed no afib and his Pradaxa was stopped.  The heart monitor did show a 5 beat run of WCT and a coronary CTA was done showing no CAD and a Ca score of 0.  He is here today for followup and is doing well.  He denies any chest pain or pressure,  PND, orthopnea, LE edema, dizziness (except standing up too fast), palpitations or syncope. He has gained about 15lbs after retiring and has noticed some mild DOE at times but it mainly is when he runs of the stairs.  He is compliant with his meds and is tolerating meds with no SE.    Prior CV studies:   The following studies were reviewed today:  EKG  Past Medical History:  Diagnosis Date  . Adult body mass index 29.0-29.9   . Chronic anticoagulation   . Diverticulosis   . Impaired glucose tolerance   . Longstanding persistent atrial fibrillation ([facility])   . PAF (paroxysmal atrial fibrillation) ([facility])    Past Surgical History:  Procedure Laterality Date  . CATARACT EXTRACTION, BILATERAL       Current Meds  Medication Sig  . sotalol (BETAPACE) 80 MG tablet Take 40 mg by mouth 2 (two) times a day.     Allergies:   Patient has no known allergies.   Social History   Tobacco Use  . Smoking status: Never Smoker  . Smokeless tobacco: Never Used     Family Hx: The patient's family history includes Congestive Heart Failure in his mother.  ROS:    Please see the history of present illness.     All other systems reviewed and are negative.   Labs/Other Tests and Data Reviewed:    Recent Labs: No results found for requested labs within last 8760 hours.   Recent Lipid Panel No results found for: CHOL, TRIG, HDL, CHOLHDL, LDLCALC, LDLDIRECT  Wt Readings from Last 3 Encounters:  09/28/20 212 lb 12.8 oz (96.5 kg)  10/02/19 198 lb (89.8 kg)  02/20/19 200 lb (90.7 kg)    EKG was performed in the office today and showed NSR with no ST changes and QTC 04/22/19  Objective:    Vital Signs:  BP 126/76   Pulse 64   Ht 5' 10.5" (1.791 m)   Wt 212 lb 12.8 oz (96.5 kg)   SpO2 98%   BMI 30.10 kg/m    GEN: Well nourished, well developed in no acute distress HEENT: Normal NECK: No JVD; No carotid bruits LYMPHATICS: No lymphadenopathy CARDIAC:RRR, no murmurs, rubs, gallops RESPIRATORY:  Clear to auscultation without rales, wheezing or rhonchi  ABDOMEN: Soft, non-tender, non-distended MUSCULOSKELETAL:  No edema; No deformity  SKIN: Warm and dry NEUROLOGIC:  Alert and oriented x 3 PSYCHIATRIC:  Normal affect    ASSESSMENT & PLAN:  1.  PAF -he his maintaining NSR with no palpitations since I saw him last -he will continue on Sotolol 40mg  BID -QTc on EKG today in the office -Pradaxa was stopped due to CHADS2VASC score of 1 (age>65) and no PAF on event monitor -we discussed the correlation between afib and obesity and I encouraged him to try to make a goal of losing weight in the new year  2.  Obesity -I have encouraged him to get into a routine exercise program and cut back on carbs and portions.    Medication Adjustments/Labs and Tests Ordered: Current medicines are reviewed at length with the patient today.  Concerns regarding medicines are outlined above.  Tests Ordered: Orders Placed This Encounter  Procedures  . EKG 12-Lead   Medication Changes: No orders of the defined types were placed in this  encounter.   Disposition:  Follow up in 1 year(s)  Signed, , MD  09/28/2020 9:54 AM    [facility]

## 2020-09-28 NOTE — Patient Instructions (Signed)

## 2022-01-06 ENCOUNTER — Ambulatory Visit: Payer: Medicare HMO | Admitting: Physician Assistant

## 2022-01-06 ENCOUNTER — Encounter: Payer: Self-pay | Admitting: Physician Assistant

## 2022-01-06 ENCOUNTER — Other Ambulatory Visit: Payer: Self-pay

## 2022-01-06 VITALS — BP 134/72 | HR 60 | Ht 70.0 in | Wt 213.0 lb

## 2022-01-06 DIAGNOSIS — I480 Paroxysmal atrial fibrillation: Secondary | ICD-10-CM

## 2022-01-06 NOTE — Patient Instructions (Signed)
Medication Instructions:  ?Your physician recommends that you continue on your current medications as directed. Please refer to the Current Medication list given to you today. ?*If you need a refill on your cardiac medications before your next appointment, please call your pharmacy* ? ? ?Lab Work: ?None ordered ? ? ?Testing/Procedures: ?None ordered ? ? ?Follow-Up: ?At [facility], you and your health needs are our priority.  As part of our continuing mission to provide you with exceptional heart care, we have created designated Provider Care Teams.  These Care Teams include your primary Cardiologist (physician) and Advanced Practice Providers (APPs -  Physician Assistants and Nurse Practitioners) who all work together to provide you with the care you need, when you need it. ? ?We recommend signing up for the patient portal called "MyChart".  Sign up information is provided on this After Visit Summary.  MyChart is used to connect with patients for Virtual Visits (Telemedicine).  Patients are able to view lab/test results, encounter notes, upcoming appointments, etc.  Non-urgent messages can be sent to your provider as well.   ?To learn more about what you can do with MyChart, go to [email or web address].   ? ?Your next appointment:   ?12 year(s) ? ?The format for your next appointment:   ?In Person ? ?Provider:   ?Armanda Magic, MD  ? ? ?Other Instructions ?  ?

## 2022-01-06 NOTE — Progress Notes (Signed)
?Cardiology Office Note:   ? ?Date:  01/06/2022  ? ?ID:  Kevin Wilkins, DOB 1951/05/03, MRN [id number] ? ?PCP:  Koren Shiver, DO  ?[facility] Cardiologist:  Armanda Magic, MD  ?[facility] Electrophysiologist:  None  ? ?Chief Complaint: yearly follow up  ? ?History of Present Illness:   ? ?Kevin Wilkins is a 71 y.o. male with a hx of PAF (on sotolol) presents for follow up.  ? ?His only other medical problem is a hx of diverticulosis but no problems with rectal bleeding.  His CHADS2VASC score is 1 (age>65) and he wore a heart monitor to get off anticoagulation.  This showed no afib and his Pradaxa was stopped.  The heart monitor did show a 5 beat run of WCT and a coronary CTA was done showing no CAD and a Ca score of 0. Echo showed LVEF of 60-65%.  ? ?Here for follow up.  No complaints.  Patient denies chest pain, shortness of breath, dizziness, orthopnea, PND, syncope, lower extremity edema or melena.  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  Compliant with sotalol. ? ?Past Medical History:  ?Diagnosis Date  ? Adult body mass index 29.0-29.9   ? Chronic anticoagulation   ? Diverticulosis   ? Impaired glucose tolerance   ? Longstanding persistent atrial fibrillation (HCC)   ? PAF (paroxysmal atrial fibrillation) (HCC)   ? ? ?Past Surgical History:  ?Procedure Laterality Date  ? CATARACT EXTRACTION, BILATERAL    ? ? ?Current Medications: ?Current Meds  ?Medication Sig  ? sotalol (BETAPACE) 80 MG tablet Take 40 mg by mouth 2 (two) times a day.  ?  ? ?Allergies:   Patient has no known allergies.  ? ?Social History  ? ?Socioeconomic History  ? Marital status: Married  ?  Spouse name: Not on file  ? Number of children: Not on file  ? Years of education: Not on file  ? Highest education level: Not on file  ?Occupational History  ? Not on file  ?Tobacco Use  ? Smoking status: Never  ? Smokeless tobacco: Never  ?Substance and Sexual Activity  ? Alcohol use: Not on file  ? Drug use: Not on file  ? Sexual  activity: Not on file  ?Other Topics Concern  ? Not on file  ?Social History Narrative  ? Not on file  ? ?Social Determinants of Health  ? ?Financial Resource Strain: Not on file  ?Food Insecurity: Not on file  ?Transportation Needs: Not on file  ?Physical Activity: Not on file  ?Stress: Not on file  ?Social Connections: Not on file  ?  ? ?Family History: ?The patient's family history includes Congestive Heart Failure in his mother.   ? ?ROS:   ?Please see the history of present illness.    ?All other systems reviewed and are negative.  ? ?EKGs/Labs/Other Studies Reviewed:   ? ?The following studies were reviewed today: ? ?Coronary CT 04/2019 ?IMPRESSION: ?1. Coronary calcium score of 0. This was 0 percentile for age and ?sex matched control. ?  ?2. Normal coronary origin with right dominance. ?  ?3. No evidence of CAD. ? ?Monitor 03/2019 ?Sinus bradycardia, normal sinus rhythm and sinus tachcyardia. The average heart rate was 58bpm. The heart rate ranged from 45 to 130bpm. ?Wide complex tachycardia up to 5 beats ? ?Echo 03/2019 ?1. The left ventricle has normal systolic function with an ejection  ?fraction of 60-65%. The cavity size was normal. There is mildly increased  ?left ventricular wall thickness.  Left ventricular diastolic parameters  ?were normal.  ? 2. The right ventricle has normal systolic function. The cavity was  ?normal. There is no increase in right ventricular wall thickness.  ? 3. Mild thickening of the mitral valve leaflet.  ? 4. The aortic valve is tricuspid. Mild thickening of the aortic valve.  ?Mild calcification of the aortic valve.  ? ?EKG:  EKG is ordered today.  The ekg ordered today demonstrates normal sinus rhythm ? ?Recent Labs: ?No results found for requested labs within last 8760 hours.  ?Recent Lipid Panel ?No results found for: CHOL, TRIG, HDL, CHOLHDL, VLDL, LDLCALC, LDLDIRECT ? ? ?Risk Assessment/Calculations:   ? ?CHA2DS2-VASc Score = 1  ? This indicates a 0.6% annual risk of  stroke. ?The patient's score is based upon: ?CHF History: 0 ?HTN History: 0 ?Diabetes History: 0 ?Stroke History: 0 ?Vascular Disease History: 0 ?Age Score: 1 ?Gender Score: 0 ?  ?  ? ? ?Physical Exam:   ? ?VS:  BP 134/72 (BP Location: Left Arm, Patient Position: Sitting, Cuff Size: Normal)   Pulse 60   Ht 5\' 10"  (1.778 m)   Wt 213 lb (96.6 kg)   SpO2 97%   BMI 30.56 kg/m?    ? ?Wt Readings from Last 3 Encounters:  ?01/06/22 213 lb (96.6 kg)  ?09/28/20 212 lb 12.8 oz (96.5 kg)  ?10/02/19 198 lb (89.8 kg)  ?  ? ?GEN:  Well nourished, well developed in no acute distress ?HEENT: Normal ?NECK: No JVD; No carotid bruits ?LYMPHATICS: No lymphadenopathy ?CARDIAC: RRR, no murmurs, rubs, gallops ?RESPIRATORY:  Clear to auscultation without rales, wheezing or rhonchi  ?ABDOMEN: Soft, non-tender, non-distended ?MUSCULOSKELETAL:  No edema; No deformity  ?SKIN: Warm and dry ?NEUROLOGIC:  Alert and oriented x 3 ?PSYCHIATRIC:  Normal affect  ? ?ASSESSMENT AND PLAN:  ? ? ?PAF ?- Pradaxa was stopped [facility] 1 (age>65) and no PAF on event monitor ?-Maintaining sinus rhythm on sotalol. ?-No symptoms concerning for recurrent A-fib. ? ? ? ?Medication Adjustments/Labs and Tests Ordered: ?Current medicines are reviewed at length with the patient today.  Concerns regarding medicines are outlined above.  ?Orders Placed This Encounter  ?Procedures  ? EKG 12-Lead  ? ?No orders of the defined types were placed in this encounter. ? ? ?Patient Instructions  ?Medication Instructions:  ?Your physician recommends that you continue on your current medications as directed. Please refer to the Current Medication list given to you today. ?*If you need a refill on your cardiac medications before your next appointment, please call your pharmacy* ? ? ?Lab Work: ?None ordered ? ? ?Testing/Procedures: ?None ordered ? ? ?Follow-Up: ?[facility], you and your health needs are our priority.  As part of our continuing mission to  provide you with exceptional heart care, we have created designated Provider Care Teams.  These Care Teams include your primary Cardiologist (physician) and Advanced Practice Providers (APPs -  Physician Assistants and Nurse Practitioners) who all work together to provide you with the care you need, when you need it. ? ?We recommend signing up for the patient portal called "MyChart".  Sign up information is provided on this After Visit Summary.  MyChart is used to connect with patients for Virtual Visits (Telemedicine).  Patients are able to view lab/test results, encounter notes, upcoming appointments, etc.  Non-urgent messages can be sent to your provider as well.   ?To learn more about what you can do with MyChart, go to [email or web address].   ? ?  Your next appointment:   ?12 year(s) ? ?The format for your next appointment:   ?In Person ? ?Provider:   ?Armanda Magic, MD  ? ? ?Other Instructions ?   ? ?Signed, ?Manson Passey, [place]  ?01/06/2022 10:48 AM    ?[facility] ?
